# Patient Record
Sex: Male | Born: 1995 | Race: White | Hispanic: No | Marital: Single | Smoking: Never smoker
Health system: Southern US, Community
[De-identification: ages and names within clinical notes are randomized; demographics above are authoritative.]

## PROBLEM LIST (undated history)

## (undated) DIAGNOSIS — J45909 Unspecified asthma, uncomplicated: Secondary | ICD-10-CM

---
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
## 2016-04-21 ENCOUNTER — Ambulatory Visit (HOSPITAL_COMMUNITY)
Admission: EM | Admit: 2016-04-21 | Discharge: 2016-04-21 | Disposition: A | Discharge status: Home / Self Care | Payer: Medicaid Other | Attending: Family Medicine | Admitting: Family Medicine

## 2016-04-21 ENCOUNTER — Encounter (HOSPITAL_COMMUNITY): Payer: Self-pay | Admitting: Emergency Medicine

## 2016-04-21 DIAGNOSIS — J029 Acute pharyngitis, unspecified: Secondary | ICD-10-CM | POA: Diagnosis not present

## 2016-04-21 DIAGNOSIS — J069 Acute upper respiratory infection, unspecified: Secondary | ICD-10-CM | POA: Diagnosis not present

## 2016-04-21 DIAGNOSIS — R0602 Shortness of breath: Secondary | ICD-10-CM | POA: Insufficient documentation

## 2016-04-21 HISTORY — DX: Unspecified asthma, uncomplicated: J45.909

## 2016-04-21 LAB — POCT RAPID STREP A: STREPTOCOCCUS, GROUP A SCREEN (DIRECT): NEGATIVE

## 2016-04-21 MED ORDER — IPRATROPIUM BROMIDE 0.06 % NA SOLN: 2.0000 | Freq: Four times a day (QID) | NASAL | 1 refills | Status: AC

## 2016-04-21 NOTE — ED Provider Notes (Signed)
[facility]    CSN: [id number] Arrival date & time: 04/21/16  1659  First Provider Contact:  First MD Initiated Contact with Patient 04/21/16 1803        History   Chief Complaint Chief Complaint  Patient presents with  . Sore Throat  . Shortness of Breath    HPI Bryan Hess is a 20 y.o. male.    Sore Throat  This is a new problem. The current episode started yesterday. The problem has not changed since onset.Pertinent negatives include no chest pain and no abdominal pain. The symptoms are aggravated by swallowing.    Past Medical History:  Diagnosis Date  . Asthma     There are no active problems to display for this patient.   History reviewed. No pertinent surgical history.     Home Medications    Prior to Admission medications   Medication Sig Start Date End Date Taking? Authorizing Provider  acetaminophen (TYLENOL) 500 MG tablet Take 500 mg by mouth every 6 (six) hours as needed.   Yes Historical Provider, MD  HYDROcodone-acetaminophen (NORCO/VICODIN) 5-325 MG per tablet Take 2 tablets by mouth every 4 (four) hours as needed for pain. 02/25/13   Viviano Simas, NP    Family History No family history on file.  Social History Social History  Substance Use Topics  . Smoking status: Never Smoker  . Smokeless tobacco: Never Used  . Alcohol use No     Allergies   Review of patient's allergies indicates no known allergies.   Review of Systems Review of Systems  Constitutional: Negative for fever.  HENT: Positive for congestion, postnasal drip, rhinorrhea and sore throat.   Respiratory: Negative.   Cardiovascular: Negative.  Negative for chest pain.  Gastrointestinal: Negative.  Negative for abdominal pain.  All other systems reviewed and are negative.    Physical Exam Triage Vital Signs ED Triage Vitals [04/21/16 1733]  Enc Vitals Group     BP 142/96     Pulse Rate 87     Resp 24     Temp 97.9 F (36.6 C)     Temp Source Oral      SpO2 97 %     Weight      Height      Head Circumference      Peak Flow      Pain Score      Pain Loc      Pain Edu?      Excl. in GC?    No data found.   Updated Vital Signs BP 142/96   Pulse 87   Temp 97.9 F (36.6 C) (Oral)   Resp 24   SpO2 97%   Visual Acuity Right Eye Distance:   Left Eye Distance:   Bilateral Distance:    Right Eye Near:   Left Eye Near:    Bilateral Near:     Physical Exam  Constitutional: He is oriented to person, place, and time. He appears well-developed and well-nourished. No distress.  HENT:  Right Ear: External ear normal.  Left Ear: External ear normal.  Mouth/Throat: Oropharynx is clear and moist. No oropharyngeal exudate.  Neck: Normal range of motion. Neck supple.  Cardiovascular: Normal rate.   Pulmonary/Chest: Effort normal and breath sounds normal.  Lymphadenopathy:    He has no cervical adenopathy.  Neurological: He is alert and oriented to person, place, and time.  Skin: Skin is warm and dry. No rash noted.  Nursing note and vitals  reviewed.    UC Treatments / Results  Labs (all labs ordered are listed, but only abnormal results are displayed) Labs Reviewed  POCT RAPID STREP A  strep neg.  EKG  EKG Interpretation None       Radiology No results found.  Procedures Procedures (including critical care time)  Medications Ordered in UC Medications - No data to display   Initial Impression / Assessment and Plan / UC Course  I have reviewed the triage vital signs and the nursing notes.  Pertinent labs & imaging results that were available during my care of the patient were reviewed by me and considered in my medical decision making (see chart for details).  Clinical Course      Final Clinical Impressions(s) / UC Diagnoses   Final diagnoses:  None    New Prescriptions New Prescriptions   No medications on file     Linna Hoff, MD 04/21/16 0600

## 2016-04-24 LAB — CULTURE, GROUP A STREP (THRC)

## 2016-08-06 ENCOUNTER — Other Ambulatory Visit: Payer: Self-pay | Admitting: Family Medicine

## 2016-08-06 ENCOUNTER — Ambulatory Visit
Admission: RE | Admit: 2016-08-06 | Discharge: 2016-08-06 | Disposition: A | Discharge status: Home / Self Care | Payer: Medicaid Other | Source: Ambulatory Visit | Attending: Family Medicine | Admitting: Family Medicine

## 2016-08-06 DIAGNOSIS — M79644 Pain in right finger(s): Secondary | ICD-10-CM

## 2018-03-20 IMAGING — CR DG FINGER THUMB 2+V*R*
3 series · 3 of 3 positions shown · non-contrast
Comparison: None.

CLINICAL DATA: Right thumb pain after recent injury

EXAM:
RIGHT THUMB 2+V

[x finger pa right *]
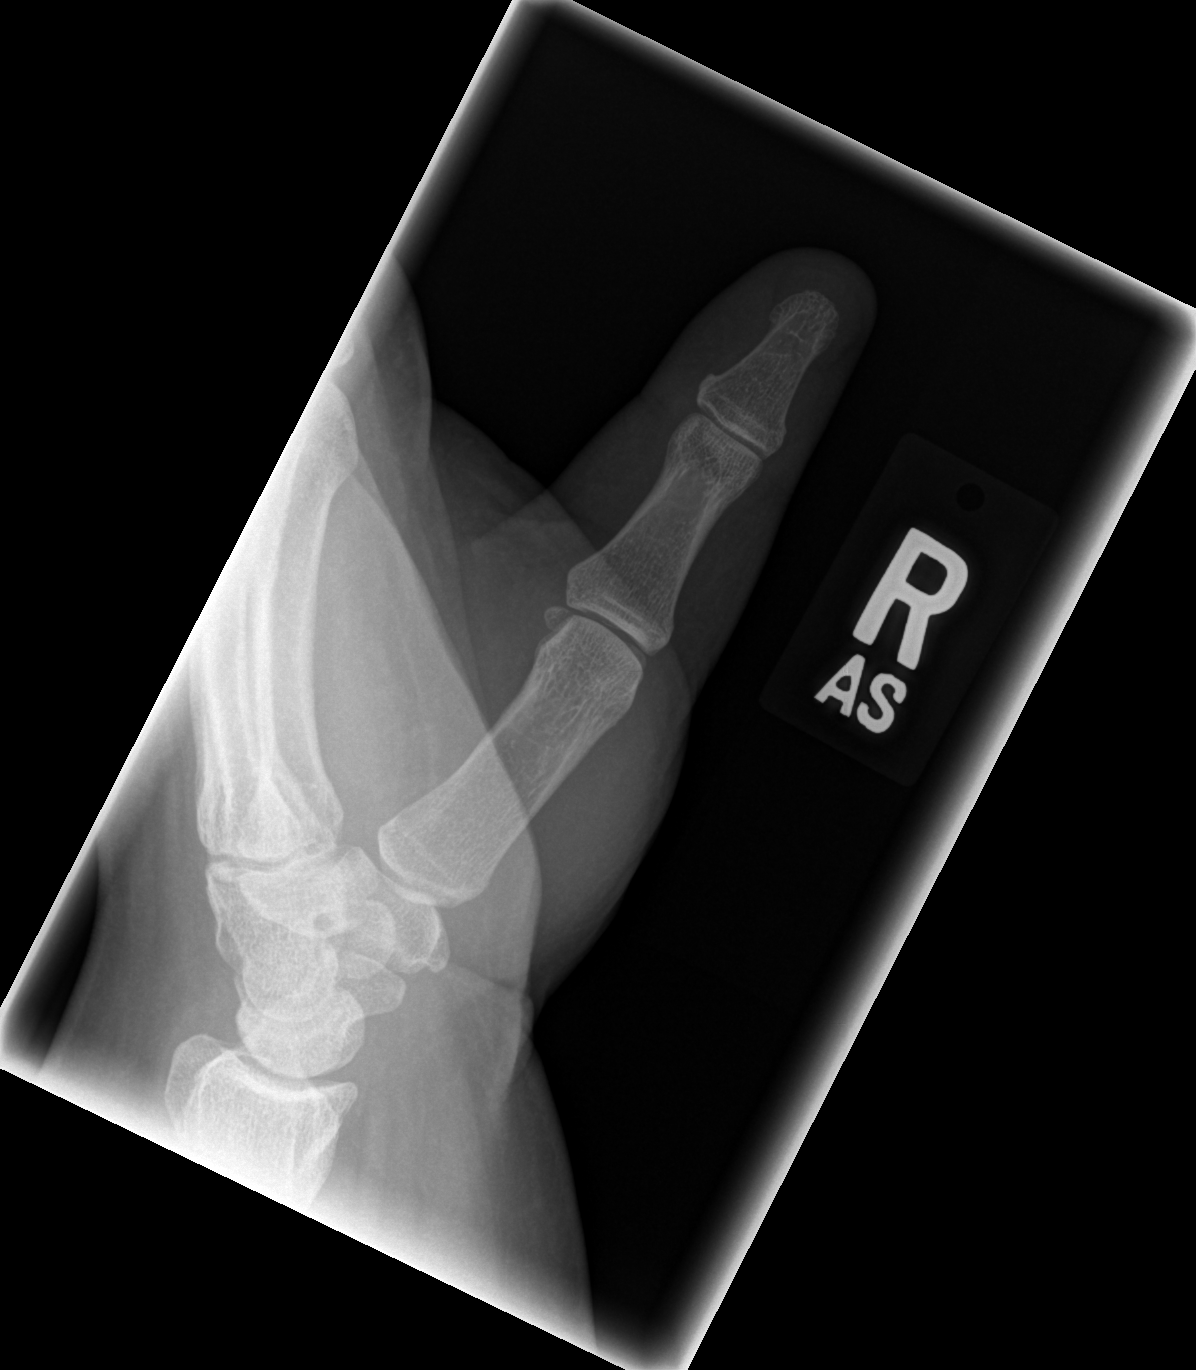

[x finger obl. right]
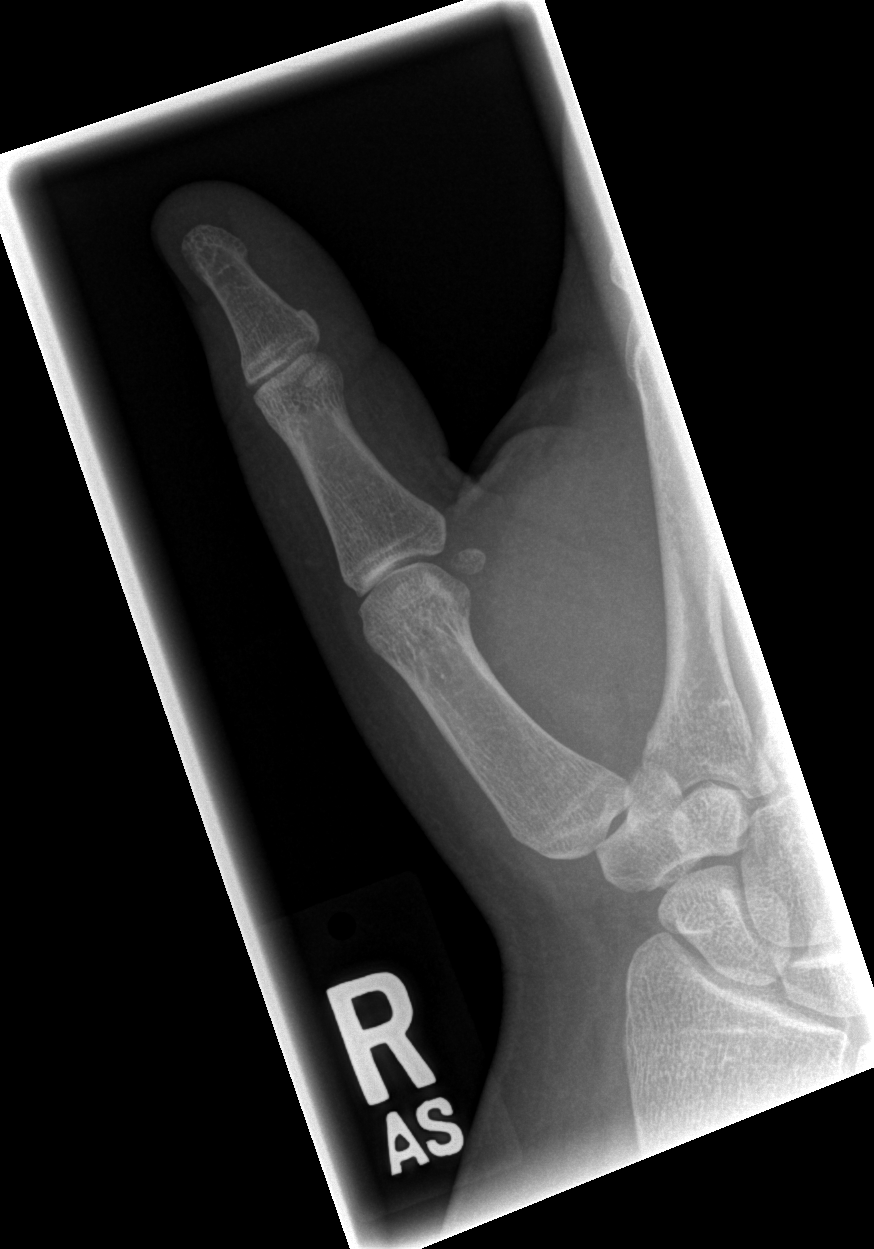

[x finger lateral right *]
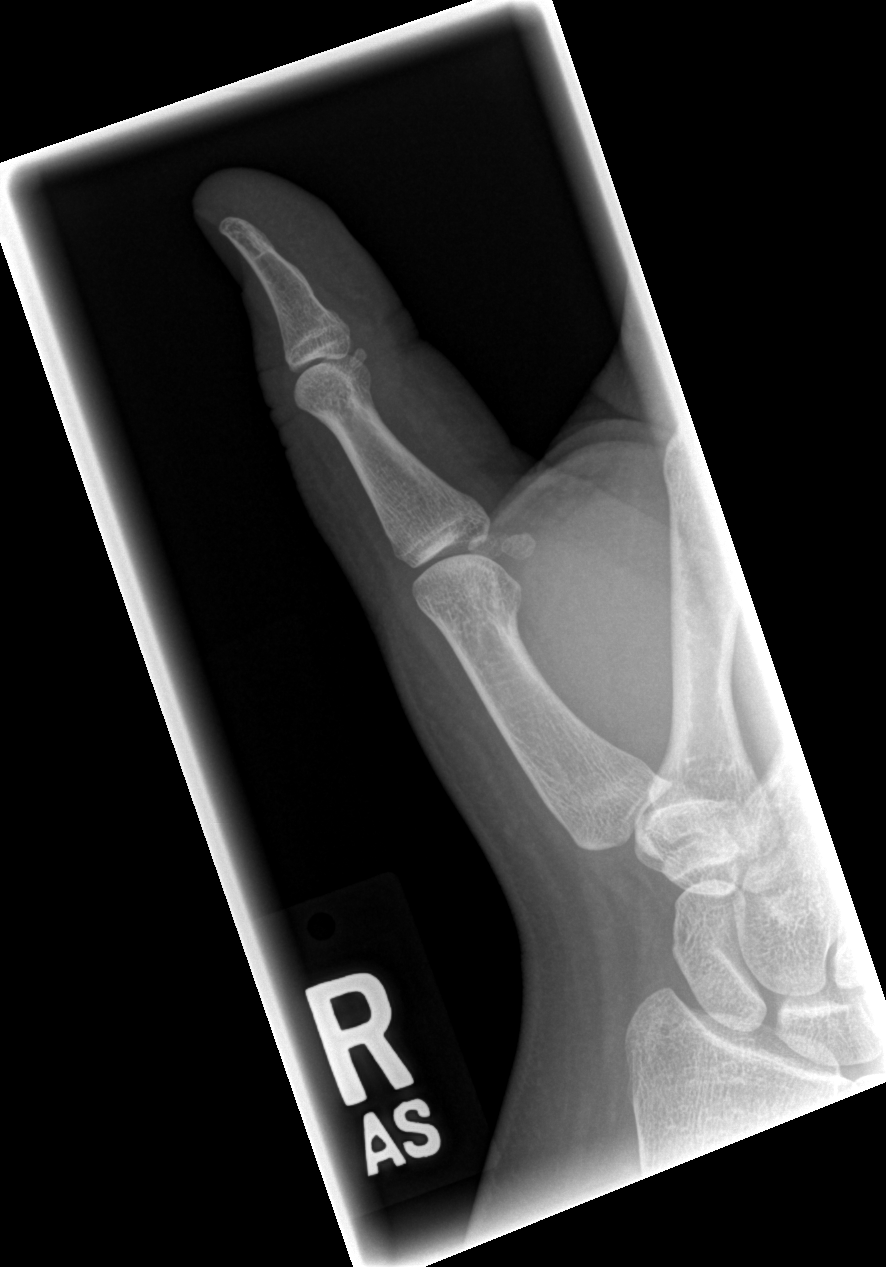

[3 of 3 positions shown; findings below may reference images not displayed]

FINDINGS: There is no evidence of fracture or dislocation. There is no
evidence of arthropathy or other focal bone abnormality. Soft
tissues are unremarkable
IMPRESSION: Negative.

## 2019-04-22 DEATH — deceased
